# Patient Record
Sex: Male | Born: 2000 | Hispanic: No | Marital: Single
Health system: Southern US, Community
[De-identification: ages and names within clinical notes are randomized; demographics above are authoritative.]

---
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
## 2022-08-26 IMAGING — CR DG ABDOMEN 1V
1 series · 1 of 1 positions shown · non-contrast
Comparison: None.

CLINICAL DATA: Constipation left lower quadrant pain

EXAM:
ABDOMEN - 1 VIEW

[t abdomen supine]
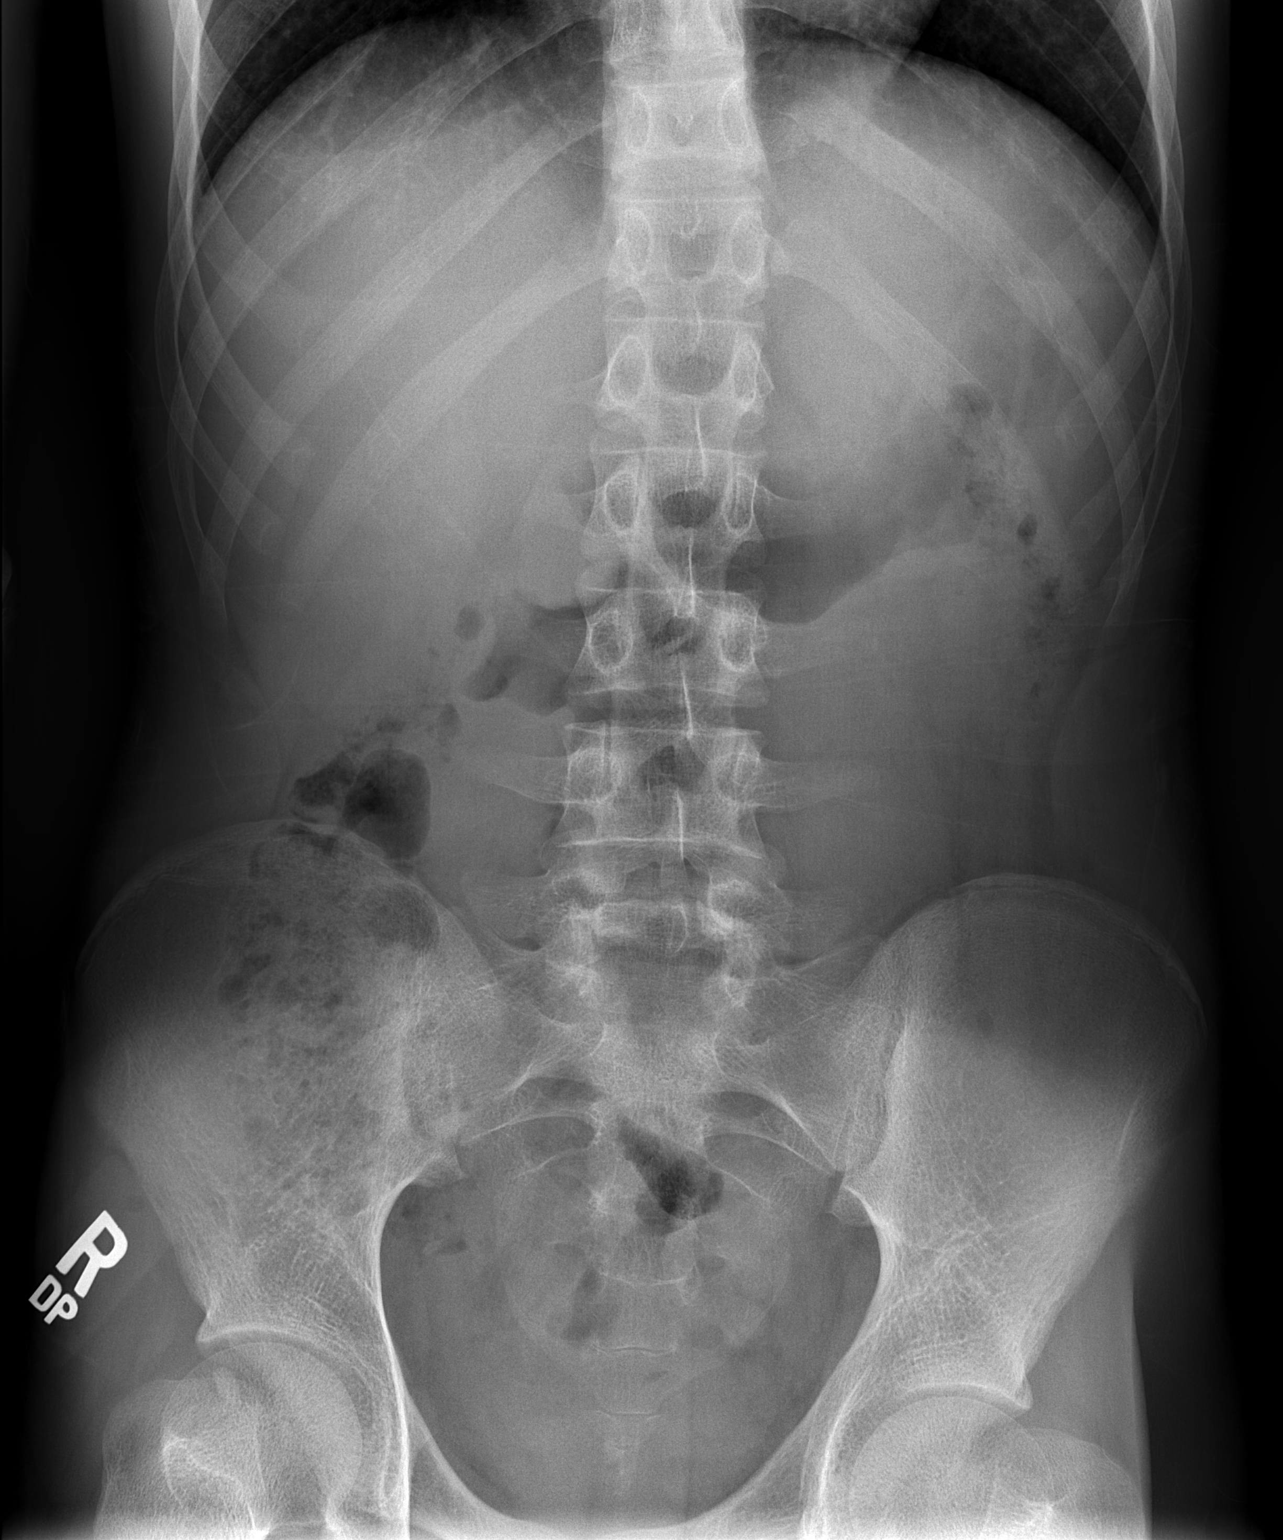

[1 of 1 positions shown; findings below may reference images not displayed]

FINDINGS: The bowel gas pattern is normal. No radio-opaque calculi or other
significant radiographic abnormality are seen. Mild stool in the
colon.
IMPRESSION: Negative.
